# Patient Record
Sex: Male | Born: 1975 | Race: Black or African American | Hispanic: No | Marital: Single | Smoking: Current every day smoker
Health system: Southern US, Community
[De-identification: ages and names within clinical notes are randomized; demographics above are authoritative.]

## PROBLEM LIST (undated history)

## (undated) DIAGNOSIS — I10 Essential (primary) hypertension: Secondary | ICD-10-CM

## (undated) DIAGNOSIS — J40 Bronchitis, not specified as acute or chronic: Secondary | ICD-10-CM

## (undated) DIAGNOSIS — M1990 Unspecified osteoarthritis, unspecified site: Secondary | ICD-10-CM

## (undated) DIAGNOSIS — B019 Varicella without complication: Secondary | ICD-10-CM

## (undated) DIAGNOSIS — J45909 Unspecified asthma, uncomplicated: Secondary | ICD-10-CM

## (undated) HISTORY — DX: Varicella without complication: B01.9

## (undated) HISTORY — DX: Unspecified osteoarthritis, unspecified site: M19.90

## (undated) HISTORY — DX: Bronchitis, not specified as acute or chronic: J40

## (undated) HISTORY — PX: WISDOM TOOTH EXTRACTION: SHX21

## (undated) HISTORY — DX: Essential (primary) hypertension: I10

---

## 2013-05-05 ENCOUNTER — Emergency Department: Payer: Self-pay | Admitting: Emergency Medicine

## 2015-04-20 ENCOUNTER — Encounter: Payer: Self-pay | Admitting: Emergency Medicine

## 2015-04-20 ENCOUNTER — Emergency Department
Admission: EM | Admit: 2015-04-20 | Discharge: 2015-04-20 | Disposition: A | Discharge status: Home / Self Care | Payer: BLUE CROSS/BLUE SHIELD | Attending: Emergency Medicine | Admitting: Emergency Medicine

## 2015-04-20 ENCOUNTER — Emergency Department: Payer: BLUE CROSS/BLUE SHIELD

## 2015-04-20 DIAGNOSIS — R0981 Nasal congestion: Secondary | ICD-10-CM

## 2015-04-20 DIAGNOSIS — R51 Headache: Secondary | ICD-10-CM | POA: Diagnosis not present

## 2015-04-20 DIAGNOSIS — Z720 Tobacco use: Secondary | ICD-10-CM | POA: Diagnosis not present

## 2015-04-20 DIAGNOSIS — M25511 Pain in right shoulder: Secondary | ICD-10-CM | POA: Diagnosis not present

## 2015-04-20 DIAGNOSIS — Z9889 Other specified postprocedural states: Secondary | ICD-10-CM | POA: Insufficient documentation

## 2015-04-20 DIAGNOSIS — R0989 Other specified symptoms and signs involving the circulatory and respiratory systems: Secondary | ICD-10-CM | POA: Insufficient documentation

## 2015-04-20 DIAGNOSIS — K088 Other specified disorders of teeth and supporting structures: Secondary | ICD-10-CM | POA: Diagnosis present

## 2015-04-20 LAB — CBC WITH DIFFERENTIAL/PLATELET
Basophils Absolute: 0 10*3/uL (ref 0–0.1)
Basophils Relative: 1 %
EOS PCT: 3 %
Eosinophils Absolute: 0.2 10*3/uL (ref 0–0.7)
HCT: 51.4 % (ref 40.0–52.0)
Hemoglobin: 16.5 g/dL (ref 13.0–18.0)
LYMPHS PCT: 28 %
Lymphs Abs: 1.8 10*3/uL (ref 1.0–3.6)
MCH: 27.2 pg (ref 26.0–34.0)
MCHC: 32.1 g/dL (ref 32.0–36.0)
MCV: 84.6 fL (ref 80.0–100.0)
Monocytes Absolute: 0.8 10*3/uL (ref 0.2–1.0)
Monocytes Relative: 13 %
NEUTROS ABS: 3.6 10*3/uL (ref 1.4–6.5)
NEUTROS PCT: 55 %
PLATELETS: 253 10*3/uL (ref 150–440)
RBC: 6.07 MIL/uL — ABNORMAL HIGH (ref 4.40–5.90)
RDW: 13.6 % (ref 11.5–14.5)
WBC: 6.4 10*3/uL (ref 3.8–10.6)

## 2015-04-20 LAB — BASIC METABOLIC PANEL
Anion gap: 10 (ref 5–15)
BUN: 11 mg/dL (ref 6–20)
CALCIUM: 9.6 mg/dL (ref 8.9–10.3)
CO2: 26 mmol/L (ref 22–32)
Chloride: 102 mmol/L (ref 101–111)
Creatinine, Ser: 1.01 mg/dL (ref 0.61–1.24)
GFR calc Af Amer: >60 mL/min (ref >60)
GLUCOSE: 100 mg/dL — AB (ref 65–99)
Potassium: 4 mmol/L (ref 3.5–5.1)
Sodium: 138 mmol/L (ref 135–145)

## 2015-04-20 MED ORDER — ORPHENADRINE CITRATE 30 MG/ML IJ SOLN
30.0000 mg | Freq: Once | INTRAMUSCULAR | Status: AC
  Administered 2015-04-20: 30 mg via INTRAVENOUS

## 2015-04-20 MED ORDER — DEXAMETHASONE SODIUM PHOSPHATE 10 MG/ML IJ SOLN
10.0000 mg | Freq: Once | INTRAMUSCULAR | Status: AC
  Administered 2015-04-20: 10 mg via INTRAMUSCULAR

## 2015-04-20 MED ORDER — ORPHENADRINE CITRATE 30 MG/ML IJ SOLN: 60.0000 mg | Freq: Two times a day (BID) | INTRAMUSCULAR | Status: DC

## 2015-04-20 MED ORDER — FEXOFENADINE-PSEUDOEPHED ER 60-120 MG PO TB12: 1.0000 | ORAL_TABLET | Freq: Two times a day (BID) | ORAL | Status: DC

## 2015-04-20 NOTE — ED Provider Notes (Signed)
[facility] Emergency Department Provider Note  ____________________________________________  Time seen: Approximately 10:28 AM  I have reviewed the triage vital signs and the nursing notes.   HISTORY  Chief Complaint Dental Pain    HPI Roberto Murphy is a 39 y.o. male complaining of left facial pain for 2-3 days. Patient state he had a tooth extraction 5 days ago. The extraction was a molar on the left lower side. Patient states since then even having headache and facial soreness. Patient is concerned now because he's had increased pain behind the left eye. Patient also complaining of nasal congestion and ear pressure. Patient denies any vision change or other I URI signs and symptoms. Patient is currently on penicillin ibuprofen and Percocets. Patient is rating his pain as a 4/10.  History reviewed. No pertinent past medical history.  There are no active problems to display for this patient.   Past Surgical History  Procedure Laterality Date  . Wisdom tooth extraction      No current outpatient prescriptions on file.  Allergies Shellfish allergy  No family history on file.  Social History History  Substance Use Topics  . Smoking status: Current Every Day Smoker  . Smokeless tobacco: Not on file  . Alcohol Use: Yes    Review of Systems Constitutional: No fever/chills. Left facial pain and pressure Eyes: No visual changes. ENT: No sore throat. Cardiovascular: Denies chest pain. Respiratory: Denies shortness of breath. Gastrointestinal: No abdominal pain.  No nausea, no vomiting.  No diarrhea.  No constipation. Genitourinary: Negative for dysuria. Musculoskeletal: Right shoulder pain which he believes is secondary to a pinched nerve. Skin: Negative for rash. Neurological: Negative for headaches, focal weakness or numbness. Allergic/Immunilogical: **} 10-point ROS otherwise negative.  ____________________________________________   PHYSICAL  EXAM:  VITAL SIGNS: ED Triage Vitals  Enc Vitals Group     BP 04/20/15 1000 144/95 mmHg     Pulse Rate 04/20/15 1000 100     Resp 04/20/15 1000 20     Temp 04/20/15 1000 99 F (37.2 C)     Temp Source 04/20/15 1000 Oral     SpO2 04/20/15 1000 98 %     Weight 04/20/15 1000 159 lb (72.122 kg)     Height 04/20/15 1000 5\' 2"  (1.575 m)     Head Cir --      Peak Flow --      Pain Score 04/20/15 1001 4     Pain Loc --      Pain Edu? --      Excl. in GC? --     Constitutional: Alert and oriented. Mild distress. Low-grade fever. Eyes: Conjunctivae are normal. PERRL. EOMI. Head: Atraumatic. Nose: Left maxillary guarding. No rhinorrhea at this time. Mouth/Throat: Mucous membranes are moist.  Oropharynx non-erythematous. No obvious erythema and mild edema around the tooth extraction left lower molar. Neck: No stridor.  No cervical spine tenderness to palpation. Hematological/Lymphatic/Immunilogical: No cervical lymphadenopathy. Cardiovascular: Normal rate, regular rhythm. Grossly normal heart sounds.  Good peripheral circulation. Respiratory: Normal respiratory effort.  No retractions. Lungs CTAB. Gastrointestinal: Soft and nontender. No distention. No abdominal bruits. No CVA tenderness. Musculoskeletal: No deformity of the right shoulder tender palpation [facility] the [facility] and decreased range of motion with abduction. Neurologic:  Normal speech and language. No gross focal neurologic deficits are appreciated. Speech is normal. No gait instability. Skin:  Skin is warm, dry and intact. No rash noted. Psychiatric: Mood and affect are normal. Speech and behavior are normal.  ____________________________________________   LABS (all labs ordered are listed, but only abnormal results are displayed)  Labs Reviewed  BASIC METABOLIC PANEL - Abnormal; Notable for the following:    Glucose, Bld 100 (*)    All other components within normal limits  CBC WITH DIFFERENTIAL/PLATELET - Abnormal;  Notable for the following:    RBC 6.07 (*)    All other components within normal limits   ____________________________________________  EKG   ____________________________________________  RADIOLOGY  No acute findings of sinus x-ray. ____________________________________________   PROCEDURES  Procedure(s) performed: None  Critical Care performed: No  ____________________________________________   INITIAL IMPRESSION / ASSESSMENT AND PLAN / ED COURSE  Pertinent labs & imaging results that were available during my care of the patient were reviewed by me and considered in my medical decision making (see chart for details).  Sinus congestion. Right shoulder pain. Patient advised to continue previous medications Sustiva narcotic pain medicine and time for inflammatory and ibuprofen. Patient will be given a prescription for Allegra-D take as directed. Patient given a work note. Patient advised follow his PCP if there is no bruise the next 2 days. ____________________________________________   FINAL CLINICAL IMPRESSION(S) / ED DIAGNOSES  Final diagnoses:  Congestion of paranasal sinus  Right shoulder pain      Joni Reining, PA-C 04/20/15 1233  Darien Ramus, MD 04/20/15 1524

## 2015-04-20 NOTE — Discharge Instructions (Signed)
Continue previous medications and start Allergra -D as directed. Wear sling for 2-3 days as needed.

## 2015-04-20 NOTE — ED Notes (Signed)
Patient transported to X-ray 

## 2015-04-20 NOTE — ED Notes (Signed)
Had tooth pulled on left side few days ago, is on antibiotics for it, now has some pain behind his left eye

## 2015-04-20 NOTE — ED Notes (Signed)
Pt to ed with c/o tooth extraction on Thursday of last week reports pain in left side of face since then, states headache and left face soreness.

## 2015-11-16 ENCOUNTER — Emergency Department: Payer: BLUE CROSS/BLUE SHIELD

## 2015-11-16 ENCOUNTER — Encounter: Payer: Self-pay | Admitting: Emergency Medicine

## 2015-11-16 ENCOUNTER — Emergency Department
Admission: EM | Admit: 2015-11-16 | Discharge: 2015-11-16 | Disposition: A | Discharge status: Home / Self Care | Payer: BLUE CROSS/BLUE SHIELD | Attending: Emergency Medicine | Admitting: Emergency Medicine

## 2015-11-16 DIAGNOSIS — J029 Acute pharyngitis, unspecified: Secondary | ICD-10-CM | POA: Diagnosis present

## 2015-11-16 DIAGNOSIS — F458 Other somatoform disorders: Secondary | ICD-10-CM | POA: Diagnosis not present

## 2015-11-16 DIAGNOSIS — Z79899 Other long term (current) drug therapy: Secondary | ICD-10-CM | POA: Diagnosis not present

## 2015-11-16 DIAGNOSIS — F17200 Nicotine dependence, unspecified, uncomplicated: Secondary | ICD-10-CM | POA: Diagnosis not present

## 2015-11-16 DIAGNOSIS — K2100 Gastro-esophageal reflux disease with esophagitis, without bleeding: Secondary | ICD-10-CM

## 2015-11-16 DIAGNOSIS — R09A2 Foreign body sensation, throat: Secondary | ICD-10-CM

## 2015-11-16 DIAGNOSIS — R300 Dysuria: Secondary | ICD-10-CM

## 2015-11-16 DIAGNOSIS — R0989 Other specified symptoms and signs involving the circulatory and respiratory systems: Secondary | ICD-10-CM

## 2015-11-16 LAB — CHLAMYDIA/NGC RT PCR (ARMC ONLY)
Chlamydia Tr: NOT DETECTED
N GONORRHOEAE: NOT DETECTED

## 2015-11-16 LAB — URINALYSIS COMPLETE WITH MICROSCOPIC (ARMC ONLY)
BILIRUBIN URINE: NEGATIVE
Bacteria, UA: NONE SEEN
Glucose, UA: NEGATIVE mg/dL
Hgb urine dipstick: NEGATIVE
KETONES UR: NEGATIVE mg/dL
Nitrite: NEGATIVE
Protein, ur: NEGATIVE mg/dL
SQUAMOUS EPITHELIAL / LPF: NONE SEEN
Specific Gravity, Urine: 1.014 (ref 1.005–1.030)
WBC, UA: NONE SEEN WBC/hpf (ref 0–5)
pH: 7 (ref 5.0–8.0)

## 2015-11-16 MED ORDER — OMEPRAZOLE 20 MG PO CPDR: 20.0000 mg | DELAYED_RELEASE_CAPSULE | Freq: Two times a day (BID) | ORAL | Status: DC

## 2015-11-16 MED ORDER — GI COCKTAIL ~~LOC~~
30.0000 mL | Freq: Once | ORAL | Status: AC
  Administered 2015-11-16: 30 mL via ORAL
  Filled 2015-11-16: qty 30

## 2015-11-16 NOTE — ED Notes (Signed)
Spoke with Dr. Scotty Court and no orders received for this pt at this time.

## 2015-11-16 NOTE — Discharge Instructions (Signed)

## 2015-11-16 NOTE — ED Notes (Addendum)
Pt to ed with c/o sore throat x 2 days.  Pt states he needs to drink water frequently in order to get solids down.  Pt states "It feels like something is stuck in my throat"  Pt states feels worse at night when he tries to lay flat.  Pt alert and oriented and denies chest pain at this time.  Pt is able to swallow saliva.

## 2015-11-16 NOTE — ED Notes (Signed)
States he developed a sore throat 2 days ago with some diff swallowing at that time..noticed some chest discomfort also.   Worse at night. No drooling noted

## 2015-11-16 NOTE — ED Provider Notes (Signed)
[facility] Emergency Department Provider Note ____________________________________________  Time seen: Approximately 1:51 PM  I have reviewed the triage vital signs and the nursing notes.   HISTORY  Chief Complaint Sore Throat   HPI Roberto Murphy is a 40 y.o. male who presents to the emergency department for evaluation of dysphasia. He states that 2 days ago he began having difficulty swallowing solid foods without also swallowing water. He states that he has had a strange sensation in his throat and a burning in his chest that seems to be worse at night. He states that he has also had some wheezing at night. He denies nausea or vomiting. He denies fever. He is also complaining of burning on urination. This started today. He denies previous history of any of the above complaints. He did take a Zantac yesterday which seemed to relieve some of the dysphasia and burning in the chest.   History reviewed. No pertinent past medical history.  There are no active problems to display for this patient.   Past Surgical History  Procedure Laterality Date  . Wisdom tooth extraction      Current Outpatient Rx  Name  Route  Sig  Dispense  Refill  . fexofenadine-pseudoephedrine (ALLEGRA-D) 60-120 MG per tablet   Oral   Take 1 tablet by mouth 2 (two) times daily.   30 tablet   0   . omeprazole (PRILOSEC) 20 MG capsule   Oral   Take 1 capsule (20 mg total) by mouth 2 (two) times daily.   60 capsule   0     Allergies Shellfish allergy  No family history on file.  Social History Social History  Substance Use Topics  . Smoking status: Current Every Day Smoker  . Smokeless tobacco: None  . Alcohol Use: Yes    Review of Systems Constitutional: No fever/chills Eyes: No visual changes. ENT: Positive for sore throat and dysphasia. Cardiovascular: Positive for chest pain. Respiratory: Denies shortness of breath. Gastrointestinal: No abdominal pain.  No  nausea, no vomiting.  No diarrhea.  No constipation. Genitourinary: Positive for dysuria. Musculoskeletal: Negative for back pain. Skin: Negative for rash. Neurological: Negative for headaches, focal weakness or numbness.  10-point ROS otherwise unremarkable.  ____________________________________________   PHYSICAL EXAM:  VITAL SIGNS: ED Triage Vitals  Enc Vitals Group     BP 11/16/15 1212 148/95 mmHg     Pulse Rate 11/16/15 1212 104     Resp 11/16/15 1212 20     Temp 11/16/15 1212 98.3 F (36.8 C)     Temp Source 11/16/15 1212 Oral     SpO2 11/16/15 1212 97 %     Weight 11/16/15 1212 159 lb (72.122 kg)     Height --      Head Cir --      Peak Flow --      Pain Score 11/16/15 1212 2     Pain Loc --      Pain Edu? --      Excl. in GC? --     Constitutional: Alert and oriented. Well appearing and in no acute distress. Eyes: Conjunctivae are normal. PERRL. EOMI. Head: Atraumatic. Nose: No congestion/rhinnorhea. Mouth/Throat: Mucous membranes are moist.  Oropharynx non-erythematous or edematous. Uvula midline Neck: No stridor.   Hematological/Lymphatic/Immunilogical: No cervical lymphadenopathy. Cardiovascular: Normal rate, regular rhythm. Grossly normal heart sounds. Good peripheral circulation. Respiratory: Normal respiratory effort.  No retractions. Lungs CTAB. Gastrointestinal: Soft and nontender. No distention. No abdominal bruits. Genitourinary: Exam deferred--patient denies  discharge or abnormality.  Musculoskeletal: No lower extremity tenderness nor edema.  No joint effusions. Neurologic:  Normal speech and language. No gross focal neurologic deficits are appreciated. No gait instability. Skin:  Skin is warm, dry and intact. No rash noted. Psychiatric: Mood and affect are normal. Speech and behavior are normal.  ____________________________________________   LABS (all labs ordered are listed, but only abnormal results are displayed)  Labs Reviewed   URINALYSIS COMPLETEWITH MICROSCOPIC ([facility] ONLY) - Abnormal; Notable for the following:    Color, Urine YELLOW (*)    APPearance CLEAR (*)    Leukocytes, UA TRACE (*)    All other components within normal limits  CHLAMYDIA/NGC RT PCR ([facility] ONLY)   ____________________________________________  EKG  Not indicated. ____________________________________________  RADIOLOGY  Mild bronchitic changes, otherwise negative for acute abnormality per radiology. ____________________________________________   PROCEDURES  Procedure(s) performed: None  Critical Care performed: No  ____________________________________________   INITIAL IMPRESSION / ASSESSMENT AND PLAN / ED COURSE  Pertinent labs & imaging results that were available during my care of the patient were reviewed by me and considered in my medical decision making (see chart for details).  GI cocktail provided moderate relief of symptoms. Will discharge home on omeprazole and have him follow up with GI. Urinalysis showing low concern for infectious process. GC and Chlamydia results are pending. Patient to return to the  ER for symptoms that change or worsen if unable to schedule an appointment with GI or PCP of choice. ____________________________________________   FINAL CLINICAL IMPRESSION(S) / ED DIAGNOSES  Final diagnoses:  Dysuria  Globus sensation  Gastroesophageal reflux disease with esophagitis      Chinita Pester, FNP 11/16/15 1522  Emily Filbert, MD 11/16/15 1534

## 2015-11-18 ENCOUNTER — Emergency Department
Admission: EM | Admit: 2015-11-18 | Discharge: 2015-11-18 | Disposition: A | Discharge status: Home / Self Care | Payer: BLUE CROSS/BLUE SHIELD | Attending: Emergency Medicine | Admitting: Emergency Medicine

## 2015-11-18 ENCOUNTER — Emergency Department: Payer: BLUE CROSS/BLUE SHIELD

## 2015-11-18 ENCOUNTER — Encounter: Payer: Self-pay | Admitting: Emergency Medicine

## 2015-11-18 DIAGNOSIS — N3090 Cystitis, unspecified without hematuria: Secondary | ICD-10-CM

## 2015-11-18 DIAGNOSIS — J45901 Unspecified asthma with (acute) exacerbation: Secondary | ICD-10-CM | POA: Insufficient documentation

## 2015-11-18 DIAGNOSIS — R319 Hematuria, unspecified: Secondary | ICD-10-CM | POA: Diagnosis present

## 2015-11-18 DIAGNOSIS — N3091 Cystitis, unspecified with hematuria: Secondary | ICD-10-CM | POA: Insufficient documentation

## 2015-11-18 DIAGNOSIS — Z79899 Other long term (current) drug therapy: Secondary | ICD-10-CM | POA: Insufficient documentation

## 2015-11-18 DIAGNOSIS — F17200 Nicotine dependence, unspecified, uncomplicated: Secondary | ICD-10-CM | POA: Insufficient documentation

## 2015-11-18 DIAGNOSIS — J209 Acute bronchitis, unspecified: Secondary | ICD-10-CM

## 2015-11-18 HISTORY — DX: Unspecified asthma, uncomplicated: J45.909

## 2015-11-18 LAB — URINALYSIS COMPLETE WITH MICROSCOPIC (ARMC ONLY)
BACTERIA UA: NONE SEEN
Bilirubin Urine: NEGATIVE
GLUCOSE, UA: NEGATIVE mg/dL
LEUKOCYTES UA: NEGATIVE
Nitrite: NEGATIVE
Protein, ur: >500 mg/dL — AB
SPECIFIC GRAVITY, URINE: 1.037 — AB (ref 1.005–1.030)
pH: 5 (ref 5.0–8.0)

## 2015-11-18 LAB — CBC WITH DIFFERENTIAL/PLATELET
Basophils Absolute: 0 10*3/uL (ref 0–0.1)
Basophils Relative: 0 %
Eosinophils Absolute: 0.1 10*3/uL (ref 0–0.7)
Eosinophils Relative: 1 %
HEMATOCRIT: 48.3 % (ref 40.0–52.0)
Hemoglobin: 15.7 g/dL (ref 13.0–18.0)
Lymphocytes Relative: 28 %
Lymphs Abs: 2.4 10*3/uL (ref 1.0–3.6)
MCH: 26.8 pg (ref 26.0–34.0)
MCHC: 32.6 g/dL (ref 32.0–36.0)
MCV: 82.5 fL (ref 80.0–100.0)
MONOS PCT: 5 %
Monocytes Absolute: 0.4 10*3/uL (ref 0.2–1.0)
NEUTROS ABS: 5.7 10*3/uL (ref 1.4–6.5)
Neutrophils Relative %: 66 %
Platelets: 242 10*3/uL (ref 150–440)
RBC: 5.85 MIL/uL (ref 4.40–5.90)
RDW: 13.7 % (ref 11.5–14.5)
WBC: 8.7 10*3/uL (ref 3.8–10.6)

## 2015-11-18 LAB — COMPREHENSIVE METABOLIC PANEL
ALBUMIN: 4.3 g/dL (ref 3.5–5.0)
ALK PHOS: 71 U/L (ref 38–126)
ALT: 19 U/L (ref 17–63)
ANION GAP: 6 (ref 5–15)
AST: 19 U/L (ref 15–41)
BUN: 12 mg/dL (ref 6–20)
CALCIUM: 9.1 mg/dL (ref 8.9–10.3)
CHLORIDE: 102 mmol/L (ref 101–111)
CO2: 29 mmol/L (ref 22–32)
Creatinine, Ser: 1.02 mg/dL (ref 0.61–1.24)
GFR calc non Af Amer: >60 mL/min (ref >60)
GLUCOSE: 99 mg/dL (ref 65–99)
Potassium: 3.9 mmol/L (ref 3.5–5.1)
SODIUM: 137 mmol/L (ref 135–145)
Total Bilirubin: 1 mg/dL (ref 0.3–1.2)
Total Protein: 7.8 g/dL (ref 6.5–8.1)

## 2015-11-18 MED ORDER — PHENAZOPYRIDINE HCL 100 MG PO TABS: 100.0000 mg | ORAL_TABLET | Freq: Three times a day (TID) | ORAL | Status: DC | PRN

## 2015-11-18 MED ORDER — CIPROFLOXACIN HCL 500 MG PO TABS: 500.0000 mg | ORAL_TABLET | Freq: Two times a day (BID) | ORAL | Status: DC

## 2015-11-18 MED ORDER — ALBUTEROL SULFATE HFA 108 (90 BASE) MCG/ACT IN AERS: 1.0000 | INHALATION_SPRAY | Freq: Four times a day (QID) | RESPIRATORY_TRACT | Status: DC | PRN

## 2015-11-18 MED ORDER — ALBUTEROL SULFATE (2.5 MG/3ML) 0.083% IN NEBU
5.0000 mg | INHALATION_SOLUTION | Freq: Once | RESPIRATORY_TRACT | Status: AC
Start: 2015-11-18 — End: 2015-11-18
  Administered 2015-11-18: 5 mg via RESPIRATORY_TRACT
  Filled 2015-11-18: qty 6

## 2015-11-18 NOTE — ED Provider Notes (Signed)
[facility] Emergency Department Provider Note  ____________________________________________  Time seen: Approximately 12:08 PM  I have reviewed the triage vital signs and the nursing notes.   HISTORY  Chief Complaint Hematuria    HPI Bufford Helms is a 40 y.o. male, NAD, reports the emergency department with episode of hematuria this morning. Also notes some decrease in urinary stream with increase in urinary frequency with some hesitancy. Denies any abdominal pain, lower back pain, urethral discharge. No fevers, chills, body aches. Was seen 2 days ago in this emergency department for upper respiratory symptoms. States he continues with cough, chest congestion and some wheezing. Notes he has been taking outpatient medicines as prescribed.   Past Medical History  Diagnosis Date  . Asthma     There are no active problems to display for this patient.   Past Surgical History  Procedure Laterality Date  . Wisdom tooth extraction      Current Outpatient Rx  Name  Route  Sig  Dispense  Refill  . albuterol (PROVENTIL HFA;VENTOLIN HFA) 108 (90 Base) MCG/ACT inhaler   Inhalation   Inhale 1-2 puffs into the lungs every 6 (six) hours as needed for wheezing or shortness of breath.   1 Inhaler   2   . ciprofloxacin (CIPRO) 500 MG tablet   Oral   Take 1 tablet (500 mg total) by mouth 2 (two) times daily.   20 tablet   0   . fexofenadine-pseudoephedrine (ALLEGRA-D) 60-120 MG per tablet   Oral   Take 1 tablet by mouth 2 (two) times daily.   30 tablet   0   . omeprazole (PRILOSEC) 20 MG capsule   Oral   Take 1 capsule (20 mg total) by mouth 2 (two) times daily.   60 capsule   0   . phenazopyridine (PYRIDIUM) 100 MG tablet   Oral   Take 1 tablet (100 mg total) by mouth 3 (three) times daily as needed for pain (May take 1-2 as needed three times daily).   20 tablet   0     Allergies Shellfish allergy  No family history on file.  Social  History Social History  Substance Use Topics  . Smoking status: Current Every Day Smoker  . Smokeless tobacco: None  . Alcohol Use: Yes     Review of Systems  Constitutional: No fever/chills. Some fatigue. ENT: No sore throat. No nasal congestion Cardiovascular: No chest pain, palpitations. Respiratory: Positive for cough, wheeze. Denies shortness of breath Gastrointestinal: No abdominal pain.  No nausea, no vomiting.   Genitourinary: Positive for hematuria, urinary frequency and urinary hesitancy with some urgency. Negative for dysuria, urethral discharge Musculoskeletal: Negative for back pain nor any other myalgias Skin: Negative for rash or lesions Neurological: Negative for headaches, focal weakness or numbness. 10-point ROS otherwise negative.  ____________________________________________   PHYSICAL EXAM:  VITAL SIGNS: ED Triage Vitals  Enc Vitals Group     BP 11/18/15 1130 167/109 mmHg     Pulse Rate 11/18/15 1130 107     Resp 11/18/15 1130 16     Temp 11/18/15 1130 98.7 F (37.1 C)     Temp Source 11/18/15 1130 Oral     SpO2 11/18/15 1130 94 %     Weight 11/18/15 1130 145 lb (65.772 kg)     Height 11/18/15 1130  (1.575 m)     Head Cir --      Peak Flow --      Pain Score 11/18/15  1130 5     Pain Loc --      Pain Edu? --      Excl. in GC? --     Constitutional: Alert and oriented. Well appearing and in no acute distress. Eyes: Conjunctivae are normal.  Head: Atraumatic. ENT:      Ears:       Nose: No congestion/rhinnorhea.      Mouth/Throat:  Neck: No stridor.  Hematological/Lymphatic/Immunilogical: No cervical lymphadenopathy. Cardiovascular: Normal rate, regular rhythm. Normal S1 and S2.   Respiratory: Normal respiratory effort without tachypnea or retractions. Mild wheezing bilaterally. Breath sounds noted throughout bilaterally. Gastrointestinal: Soft and nontender. No distention. No CVA tenderness. Musculoskeletal: No lower extremity  tenderness nor edema.  No joint effusions. Neurologic:  Normal speech and language. No gross focal neurologic deficits are appreciated.  Skin:  Skin is warm, dry and intact. No rash noted. Psychiatric: Mood and affect are normal. Speech and behavior are normal. Patient exhibits appropriate insight and judgement.   ____________________________________________   LABS (all labs ordered are listed, but only abnormal results are displayed)  Labs Reviewed  URINALYSIS COMPLETEWITH MICROSCOPIC ([facility] ONLY) - Abnormal; Notable for the following:    Color, Urine AMBER (*)    APPearance HAZY (*)    Ketones, ur 1+ (*)    Specific Gravity, Urine 1.037 (*)    Hgb urine dipstick 3+ (*)    Protein, ur >500 (*)    Squamous Epithelial / LPF 0-5 (*)    All other components within normal limits  URINE CULTURE  COMPREHENSIVE METABOLIC PANEL  CBC WITH DIFFERENTIAL/PLATELET   ____________________________________________  EKG  None ____________________________________________  RADIOLOGY I, Hagler,Jami L, personally viewed and evaluated these images (plain radiographs) as part of my medical decision making, as well as reviewing the written report by the radiologist.  Ct Renal Stone Study  11/18/2015  CLINICAL DATA:  Dysuria and hematuria for 2 days, looked like he had tissue in his urine in the ED EXAM: CT ABDOMEN AND PELVIS WITHOUT CONTRAST TECHNIQUE: Multidetector CT imaging of the abdomen and pelvis was performed following the standard protocol without IV contrast. Sagittal and coronal MPR images reconstructed from axial data set. Oral contrast not administered for this indication. COMPARISON:  None FINDINGS: Lung bases clear. Kidneys normal appearance for noncontrast technique. No mass, hydronephrosis, ureteral dilatation or ureteral calcification. Within limits of a nonenhanced exam, liver, gallbladder, spleen, pancreas, and adrenal glands normal appearance. Thickened bladder wall with mild  perivesicular infiltration compatible with cystitis. Normal appendix. Stomach and bowel loops normal appearance for technique. No mass, adenopathy, hernia, free air or free fluid. Osseous structures unremarkable. IMPRESSION: Thickened bladder wall with mild perivesicular infiltration compatible with cystitis. Otherwise negative exam. Electronically Signed   By: Ulyses Southward M.D.   On: 11/18/2015 13:29    ____________________________________________    PROCEDURES  Procedure(s) performed: None    Medications  albuterol (PROVENTIL) (2.5 MG/3ML) 0.083% nebulizer solution 5 mg (5 mg Nebulization Given 11/18/15 1232)     ____________________________________________   INITIAL IMPRESSION / ASSESSMENT AND PLAN / ED COURSE  ----------------------------------------- 1:03 PM on 11/18/2015 -----------------------------------------  I personally completed lung auscultation after cessation of breathing treatment. Lungs were CTAB.   ----------------------------------------- 1:40 PM on 11/18/2015 -----------------------------------------  CT stone protocol notes no significant abnormalities other than some thickening of the bladder wall potentially relatable to cystitis.  Pertinent labs & imaging results that were available during my care of the patient were reviewed by me and considered in my medical decision  making (see chart for details).  Patient's diagnosis is consistent with cystitis with hematuria as well as acute bronchitis with bronchospasm. CT results per above. All lab results including CBC with differential and CMP were completed without any abnormalities. Patient will be discharged home with prescriptions for Cipro to take twice a day for 10 days to cover for any urinary bacterial infection, Pyridium for urinary pain and an albuterol inhaler to be used as needed for wheezing or cough. Patient is to follow up with urology for further workup of cystitis and heamturia. Patient advised to  establish with a primary care provider to follow-up if symptoms persist past this treatment course and manage chronic additions. Patient is given ED precautions to return to the ED for any worsening or new symptoms.     ____________________________________________  FINAL CLINICAL IMPRESSION(S) / ED DIAGNOSES  Final diagnoses:  Cystitis  Hematuria  Bronchospasm with bronchitis, acute      NEW MEDICATIONS STARTED DURING THIS VISIT:  New Prescriptions   ALBUTEROL (PROVENTIL HFA;VENTOLIN HFA) 108 (90 BASE) MCG/ACT INHALER    Inhale 1-2 puffs into the lungs every 6 (six) hours as needed for wheezing or shortness of breath.   CIPROFLOXACIN (CIPRO) 500 MG TABLET    Take 1 tablet (500 mg total) by mouth 2 (two) times daily.   PHENAZOPYRIDINE (PYRIDIUM) 100 MG TABLET    Take 1 tablet (100 mg total) by mouth 3 (three) times daily as needed for pain (May take 1-2 as needed three times daily).        Hope Pigeon, PA-C 11/18/15 1422  Emily Filbert, MD 11/18/15 [phone]

## 2015-11-18 NOTE — ED Notes (Signed)
Pt says he has blood in urine since this am.  Says was seen here recent for resp, and had urine test then for burning with urination.

## 2015-11-18 NOTE — Discharge Instructions (Signed)

## 2015-11-18 NOTE — ED Notes (Signed)
States he was seen 2 days ago..but states he noticed some blood in urine today  Mainly at the end of the stream

## 2015-11-20 LAB — URINE CULTURE

## 2015-11-24 ENCOUNTER — Emergency Department (HOSPITAL_COMMUNITY)
Admission: EM | Admit: 2015-11-24 | Discharge: 2015-11-24 | Disposition: A | Discharge status: Home / Self Care | Payer: BLUE CROSS/BLUE SHIELD | Attending: Emergency Medicine | Admitting: Emergency Medicine

## 2015-11-24 ENCOUNTER — Ambulatory Visit: Payer: Self-pay | Admitting: Urology

## 2015-11-24 ENCOUNTER — Encounter (HOSPITAL_COMMUNITY): Payer: Self-pay | Admitting: Emergency Medicine

## 2015-11-24 DIAGNOSIS — J45909 Unspecified asthma, uncomplicated: Secondary | ICD-10-CM | POA: Insufficient documentation

## 2015-11-24 DIAGNOSIS — Z87891 Personal history of nicotine dependence: Secondary | ICD-10-CM | POA: Insufficient documentation

## 2015-11-24 DIAGNOSIS — Z79899 Other long term (current) drug therapy: Secondary | ICD-10-CM | POA: Diagnosis not present

## 2015-11-24 DIAGNOSIS — R21 Rash and other nonspecific skin eruption: Secondary | ICD-10-CM | POA: Diagnosis not present

## 2015-11-24 DIAGNOSIS — Z792 Long term (current) use of antibiotics: Secondary | ICD-10-CM | POA: Diagnosis not present

## 2015-11-24 MED ORDER — PREDNISONE 50 MG PO TABS: 50.0000 mg | ORAL_TABLET | Freq: Every day | ORAL | Status: DC

## 2015-11-24 MED ORDER — DIPHENHYDRAMINE HCL 25 MG PO CAPS
25.0000 mg | ORAL_CAPSULE | Freq: Once | ORAL | Status: AC
  Administered 2015-11-24: 25 mg via ORAL
  Filled 2015-11-24: qty 1

## 2015-11-24 MED ORDER — PREDNISONE 20 MG PO TABS
60.0000 mg | ORAL_TABLET | Freq: Once | ORAL | Status: AC
  Administered 2015-11-24: 60 mg via ORAL
  Filled 2015-11-24: qty 3

## 2015-11-24 NOTE — Discharge Instructions (Signed)
Take Benadryl as well.  Return here as needed.  Follow-up with a primary care doctor

## 2015-11-24 NOTE — ED Notes (Signed)
Pt reports rash to hands and feet x 1 week which is painful in nature. Pt alert x4. NAD at this time.

## 2015-11-24 NOTE — ED Provider Notes (Signed)
CSN: [id number]     Arrival date & time 11/24/15  4098 History   First MD Initiated Contact with Patient 11/24/15 1010     Chief Complaint  Patient presents with  . Rash     (Consider location/radiation/quality/duration/timing/severity/associated sxs/prior Treatment) HPI Patient presents to the emergency department with rash to the hands and feet 1 week.  The patient states that there is some pain associated with this along with itching.  Patient states that he has not had any fever, nausea, vomiting, weakness, dizziness, headache, blurred vision, cough, dysuria, incontinence, bloody stool, hematemesis, chest pain, shortness of breath, abdominal pain, near syncope or syncope.  Patient states he did not take any medications for this.  He was recently hospitalized from another issue Past Medical History  Diagnosis Date  . Asthma    Past Surgical History  Procedure Laterality Date  . Wisdom tooth extraction     No family history on file. Social History  Substance Use Topics  . Smoking status: Former Games developer  . Smokeless tobacco: None  . Alcohol Use: Yes    Review of Systems   All other systems negative except as documented in the HPI. All pertinent positives and negatives as reviewed in the HPI.  Allergies  Shellfish allergy  Home Medications   Prior to Admission medications   Medication Sig Start Date End Date Taking? Authorizing Provider  albuterol (PROVENTIL HFA;VENTOLIN HFA) 108 (90 Base) MCG/ACT inhaler Inhale 1-2 puffs into the lungs every 6 (six) hours as needed for wheezing or shortness of breath. 11/18/15  Yes Jami L Hagler, PA-C  ciprofloxacin (CIPRO) 500 MG tablet Take 1 tablet (500 mg total) by mouth 2 (two) times daily. 11/18/15  Yes Jami L Hagler, PA-C  fexofenadine-pseudoephedrine (ALLEGRA-D) 60-120 MG per tablet Take 1 tablet by mouth 2 (two) times daily. 04/20/15  Yes Joni Reining, PA-C  omeprazole (PRILOSEC) 20 MG capsule Take 1 capsule (20 mg total) by mouth 2  (two) times daily. 11/16/15 11/15/16 Yes Cari B Triplett, FNP   BP 135/83 mmHg  Pulse 85  Temp(Src) 98.2 F (36.8 C) (Oral)  Resp 16  Wt 69.355 kg  SpO2 98% Physical Exam  Constitutional: He is oriented to person, place, and time. He appears well-developed and well-nourished. No distress.  HENT:  Head: Normocephalic and atraumatic.  Mouth/Throat: Oropharynx is clear and moist.  Eyes: Pupils are equal, round, and reactive to light.  Neck: Normal range of motion. Neck supple.  Cardiovascular: Normal rate, regular rhythm and normal heart sounds.  Exam reveals no gallop and no friction rub.   No murmur heard. Pulmonary/Chest: Effort normal and breath sounds normal. No respiratory distress. He has no wheezes.  Abdominal: Soft. Bowel sounds are normal. He exhibits no distension. There is no tenderness.  Neurological: He is alert and oriented to person, place, and time. He exhibits normal muscle tone. Coordination normal.  Skin: Skin is warm and dry. No rash noted. No erythema.  Psychiatric: He has a normal mood and affect. His behavior is normal.  Nursing note and vitals reviewed.   ED Course  Procedures (including critical care time) Labs Review Labs Reviewed  RPR    Imaging Review No results found. I have personally reviewed and evaluated these images and lab results as part of my medical decision-making.  Return here as needed.  Follow-up with your primary care doctor.  He should be treated with prednisone and Benadryl.  RPR was sent to the fact that he does have some rash  on his palms and soles   Charlestine Night, PA-C 11/26/15 1638  Vanetta Mulders, MD 11/26/15 2240

## 2015-11-25 LAB — RPR: RPR Ser Ql: NONREACTIVE

## 2016-08-11 ENCOUNTER — Emergency Department (HOSPITAL_COMMUNITY)
Admission: EM | Admit: 2016-08-11 | Discharge: 2016-08-11 | Disposition: A | Discharge status: Home / Self Care | Payer: BLUE CROSS/BLUE SHIELD | Attending: Emergency Medicine | Admitting: Emergency Medicine

## 2016-08-11 ENCOUNTER — Encounter (HOSPITAL_COMMUNITY): Payer: Self-pay | Admitting: Emergency Medicine

## 2016-08-11 ENCOUNTER — Emergency Department (HOSPITAL_COMMUNITY): Payer: BLUE CROSS/BLUE SHIELD

## 2016-08-11 DIAGNOSIS — R0789 Other chest pain: Secondary | ICD-10-CM | POA: Insufficient documentation

## 2016-08-11 DIAGNOSIS — J45909 Unspecified asthma, uncomplicated: Secondary | ICD-10-CM | POA: Diagnosis not present

## 2016-08-11 DIAGNOSIS — Z87891 Personal history of nicotine dependence: Secondary | ICD-10-CM | POA: Insufficient documentation

## 2016-08-11 DIAGNOSIS — R079 Chest pain, unspecified: Secondary | ICD-10-CM | POA: Diagnosis present

## 2016-08-11 LAB — I-STAT TROPONIN, ED: Troponin i, poc: 0 ng/mL (ref 0.00–0.08)

## 2016-08-11 LAB — BASIC METABOLIC PANEL
Anion gap: 7 (ref 5–15)
BUN: 9 mg/dL (ref 6–20)
CHLORIDE: 101 mmol/L (ref 101–111)
CO2: 28 mmol/L (ref 22–32)
CREATININE: 1.08 mg/dL (ref 0.61–1.24)
Calcium: 9.5 mg/dL (ref 8.9–10.3)
Glucose, Bld: 106 mg/dL — ABNORMAL HIGH (ref 65–99)
Potassium: 4.7 mmol/L (ref 3.5–5.1)
SODIUM: 136 mmol/L (ref 135–145)

## 2016-08-11 LAB — CBC WITH DIFFERENTIAL/PLATELET
BASOS ABS: 0 10*3/uL (ref 0.0–0.1)
BASOS PCT: 0 %
EOS ABS: 0.1 10*3/uL (ref 0.0–0.7)
EOS PCT: 1 %
HCT: 49 % (ref 39.0–52.0)
HEMOGLOBIN: 16.4 g/dL (ref 13.0–17.0)
LYMPHS ABS: 2.3 10*3/uL (ref 0.7–4.0)
Lymphocytes Relative: 26 %
MCH: 28.1 pg (ref 26.0–34.0)
MCHC: 33.5 g/dL (ref 30.0–36.0)
MCV: 83.9 fL (ref 78.0–100.0)
Monocytes Absolute: 0.7 10*3/uL (ref 0.1–1.0)
Monocytes Relative: 8 %
NEUTROS PCT: 65 %
Neutro Abs: 5.8 10*3/uL (ref 1.7–7.7)
PLATELETS: 247 10*3/uL (ref 150–400)
RBC: 5.84 MIL/uL — AB (ref 4.22–5.81)
RDW: 13.8 % (ref 11.5–15.5)
WBC: 8.8 10*3/uL (ref 4.0–10.5)

## 2016-08-11 MED ORDER — DIAZEPAM 5 MG PO TABS: 5.0000 mg | ORAL_TABLET | Freq: Two times a day (BID) | ORAL | 0 refills | Status: DC

## 2016-08-11 MED ORDER — DIAZEPAM 5 MG PO TABS
5.0000 mg | ORAL_TABLET | Freq: Once | ORAL | Status: AC
  Administered 2016-08-11: 5 mg via ORAL
  Filled 2016-08-11: qty 1

## 2016-08-11 MED ORDER — IBUPROFEN 800 MG PO TABS: 800.0000 mg | ORAL_TABLET | Freq: Three times a day (TID) | ORAL | 0 refills | Status: DC

## 2016-08-11 MED ORDER — KETOROLAC TROMETHAMINE 30 MG/ML IJ SOLN
30.0000 mg | Freq: Once | INTRAMUSCULAR | Status: AC
  Administered 2016-08-11: 30 mg via INTRAVENOUS
  Filled 2016-08-11: qty 1

## 2016-08-11 NOTE — ED Provider Notes (Signed)
[facility]-EMERGENCY DEPT Provider Note   CSN: [id number] Arrival date & time: 08/11/16  0702     History   Chief Complaint Chief Complaint  Patient presents with  . Chest Pain    HPI Roberto Murphy is a 40 y.o. male.  40 year old male with asthma who presents with chest pain. 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 When he got home, he began having sharp, constant right chest pain wrapping around to his right side and back. The pain is worse with certain movements, laying on his right side, and taking a deep breath. He reports some mild shortness of breath because it hurts to take a breath. He denies any associated nausea, vomiting, or diaphoresis. He has not taken anything for the pain but did try a heating pad last night. He denies any associated leg swelling, recent travel, history of cancer, or history of blood clots. No family history of heart disease. No fever, vomiting, diarrhea, cough/cold symptoms, or recent illness.   The history is provided by the patient.  Chest Pain      Past Medical History:  Diagnosis Date  . Asthma     There are no active problems to display for this patient.   Past Surgical History:  Procedure Laterality Date  . WISDOM TOOTH EXTRACTION         Home Medications    Prior to Admission medications   Medication Sig Start Date End Date Taking? Authorizing Provider  albuterol (PROVENTIL HFA;VENTOLIN HFA) 108 (90 Base) MCG/ACT inhaler Inhale 1-2 puffs into the lungs every 6 (six) hours as needed for wheezing or shortness of breath. 11/18/15  Yes Jami L Hagler, PA-C  ipratropium-albuterol (DUONEB) 0.5-2.5 (3) MG/3ML SOLN Take 3 mLs by nebulization every 6 (six) hours as needed (for shortness of breath or wheezing).   Yes Historical Provider, MD  ciprofloxacin (CIPRO) 500 MG tablet Take 1 tablet (500 mg total) by mouth 2  (two) times daily. Patient not taking: Reported on 08/11/2016 11/18/15   Jami L Hagler, PA-C  diazepam (VALIUM) 5 MG tablet Take 1 tablet (5 mg total) by mouth 2 (two) times daily. 08/11/16   Ambrose Finland Little, MD  fexofenadine-pseudoephedrine (ALLEGRA-D) 60-120 MG per tablet Take 1 tablet by mouth 2 (two) times daily. Patient not taking: Reported on 08/11/2016 04/20/15   Joni Reining, PA-C  ibuprofen (ADVIL,MOTRIN) 800 MG tablet Take 1 tablet (800 mg total) by mouth 3 (three) times daily. 08/11/16   Laurence Spates, MD  omeprazole (PRILOSEC) 20 MG capsule Take 1 capsule (20 mg total) by mouth 2 (two) times daily. Patient not taking: Reported on 08/11/2016 11/16/15 11/15/16  Chinita Pester, FNP  predniSONE (DELTASONE) 50 MG tablet Take 1 tablet (50 mg total) by mouth daily. Patient not taking: Reported on 08/11/2016 11/24/15   Charlestine Night, PA-C    Family History No family history on file.  Social History Social History  Substance Use Topics  . Smoking status: Former Games developer  . Smokeless tobacco: Not on file  . Alcohol use Yes     Allergies   Shellfish allergy   Review of Systems Review of Systems  Cardiovascular: Positive for chest pain.   10 Systems reviewed and are negative for acute change except as noted in the HPI.   Physical Exam Updated Vital Signs BP 141/97  Pulse 87   Temp 99 F (37.2 C) (Oral)   Resp 14   Ht 5\' 2"  (1.575 m)   Wt 158 lb (71.7 kg)   SpO2 99%   BMI 28.90 kg/m   Physical Exam  Constitutional: He is oriented to person, place, and time. He appears well-developed and well-nourished. No distress.  HENT:  Head: Normocephalic and atraumatic.  Moist mucous membranes  Eyes: Conjunctivae are normal. Pupils are equal, round, and reactive to light.  Neck: Neck supple.  Cardiovascular: Normal rate, regular rhythm and normal heart sounds.   No murmur heard. Pulmonary/Chest: Effort normal and breath sounds normal. He exhibits no  tenderness.  Abdominal: Soft. Bowel sounds are normal. He exhibits no distension. There is no tenderness.  Musculoskeletal: He exhibits no edema.  R lateral chest wall pain when raising R arm above 90 degrees  Neurological: He is alert and oriented to person, place, and time.  Fluent speech  Skin: Skin is warm and dry.  Psychiatric: He has a normal mood and affect. Judgment normal.  Nursing note and vitals reviewed.    ED Treatments / Results  Labs (all labs ordered are listed, but only abnormal results are displayed) Labs Reviewed  BASIC METABOLIC PANEL - Abnormal; Notable for the following:       Result Value   Glucose, Bld 106 (*)    All other components within normal limits  CBC WITH DIFFERENTIAL/PLATELET - Abnormal; Notable for the following:    RBC 5.84 (*)    All other components within normal limits  I-STAT TROPOININ, ED    EKG  EKG Interpretation  Date/Time:  Friday August 11 2016 07:09:46 EDT Ventricular Rate:  85 PR Interval:    QRS Duration: 91 QT Interval:  343 QTC Calculation: 408 R Axis:   83 Text Interpretation:  Sinus rhythm Consider left atrial enlargement ST elev, probable normal early repol pattern No previous ECGs available Confirmed by LITTLE MD, RACHEL ([id number]) on 08/11/2016 7:21:45 AM Also confirmed by LITTLE MD, RACHEL [phone]), editor Stout CT, Jola Babinski [phone])  on 08/11/2016 7:25:17 AM       Radiology Dg Chest 2 View  Result Date: 08/11/2016 CLINICAL DATA:  Right-sided chest and back EXAM: CHEST  2 VIEW COMPARISON:  11/16/2015 FINDINGS: The heart size and mediastinal contours are within normal limits. Both lungs are clear. The visualized skeletal structures are unremarkable. IMPRESSION: No active cardiopulmonary disease. Electronically Signed   By: Alcide Clever M.D.   On: 08/11/2016 08:02    Procedures Procedures (including critical care time)  Medications Ordered in ED Medications  ketorolac (TORADOL) 30 MG/ML injection 30 mg (30 mg  Intravenous Given 08/11/16 0752)  diazepam (VALIUM) tablet 5 mg (5 mg Oral Given 08/11/16 9629)     Initial Impression / Assessment and Plan / ED Course  I have reviewed the triage vital signs and the nursing notes.  Pertinent labs & imaging results that were available during my care of the patient were reviewed by me and considered in my medical decision making (see chart for details).  Clinical Course   Pt w/ 3d of constant R lateral chest pain worse w/ movements, laying on R side, and raising R arm. Well appearing w/ reassuring VS on exam. No ischemic changes on EKG.  Labs including troponin unremarkable and chest x-ray normal.  His description of the pain and the fact that it began after a change in his physical activities suggest musculoskeletal etiology and cardiac etiology sounds very unlikely. He is  PERC negative therefore PE very unlikely. Gave the patient Toradol and Valium in the ED and discussed supportive care including NSAID course and early ROM exercises. Discussed return precautions and patient voiced understanding. Patient discharged in satisfactory condition.  Final Clinical Impressions(s) / ED Diagnoses   Final diagnoses:  Right-sided chest wall pain    New Prescriptions New Prescriptions   DIAZEPAM (VALIUM) 5 MG TABLET    Take 1 tablet (5 mg total) by mouth 2 (two) times daily.   IBUPROFEN (ADVIL,MOTRIN) 800 MG TABLET    Take 1 tablet (800 mg total) by mouth 3 (three) times daily.     Laurence Spatesachel Morgan Little, MD 08/11/16 [id number]

## 2016-08-11 NOTE — ED Notes (Signed)
Phlebotomy here to draw blood. Pt in x-ray. This NT draw blood when pt returns

## 2016-08-11 NOTE — ED Notes (Signed)
Pt refusing blood draw at this time wishes to see a MD first. MD Little made aware.

## 2016-08-11 NOTE — ED Notes (Signed)
MD at bedside. Little  

## 2016-08-11 NOTE — ED Triage Notes (Signed)
Pt presents to ED with right sided chest pain x3 day. Pt states was recently placed on albuterol. Lungs clear.

## 2016-08-11 NOTE — ED Notes (Signed)
Pt transported to Xray. 

## 2016-09-01 IMAGING — CT CT RENAL STONE PROTOCOL
1 of 2 series · 5 of 32 positions shown, 10 images · non-contrast
Comparison: None

CLINICAL DATA: Dysuria and hematuria for 2 days, looked like he had
tissue in his urine in the ED

EXAM:
CT ABDOMEN AND PELVIS WITHOUT CONTRAST
TECHNIQUE: Multidetector CT imaging of the abdomen and pelvis was performed
following the standard protocol without IV contrast. Sagittal and
coronal MPR images reconstructed from axial data set. Oral contrast
not administered for this indication.

[Series 4: lung windows · axial · 0.65mm/px · z∈[-163,-88]mm · 5 of 23 slices shown, 10 images]
[im 4/23  soft-tissue]
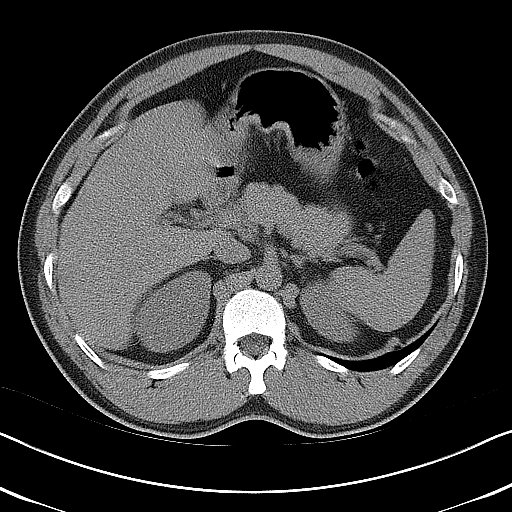
[im 4/23  bone]
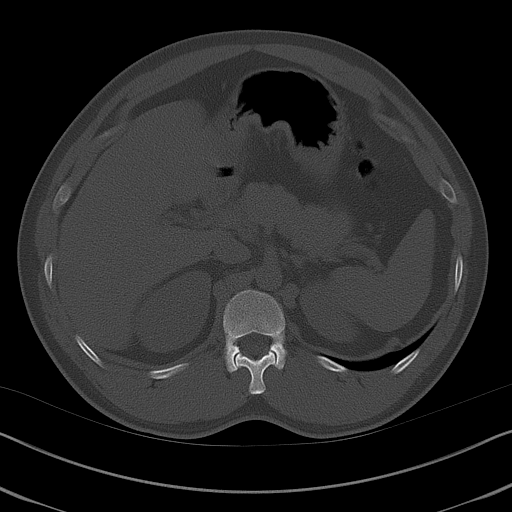
[im 8/23  soft-tissue]
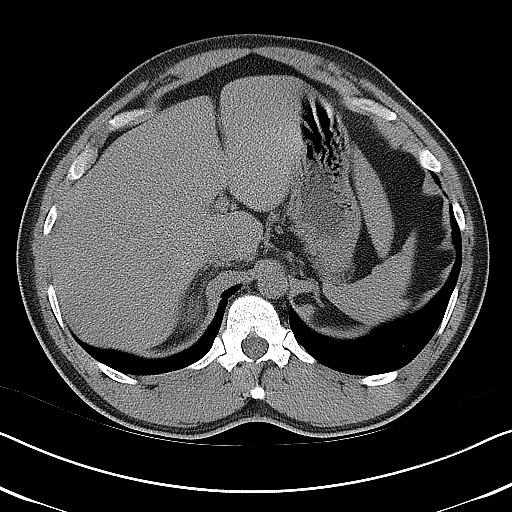
[im 8/23  lung]
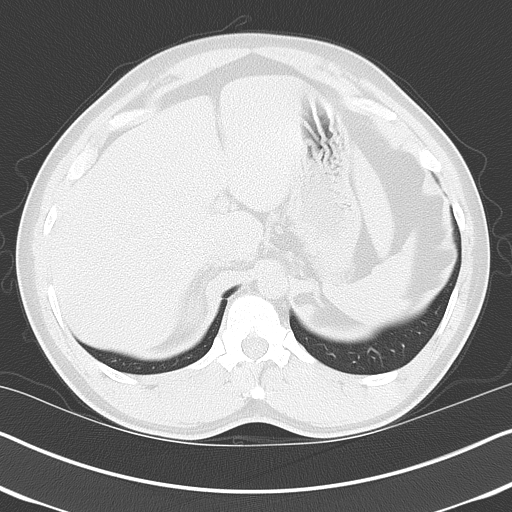
[im 12/23  soft-tissue]
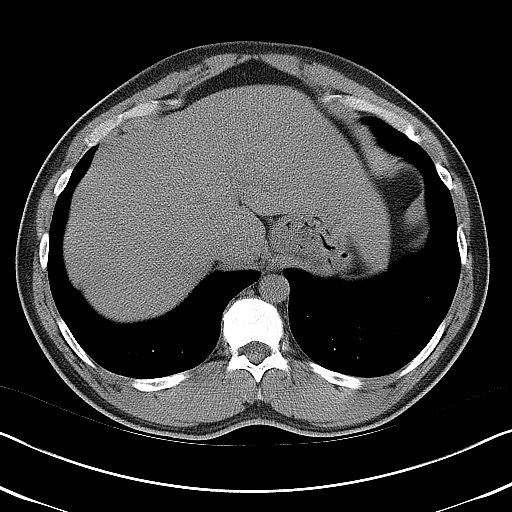
[im 12/23  lung]
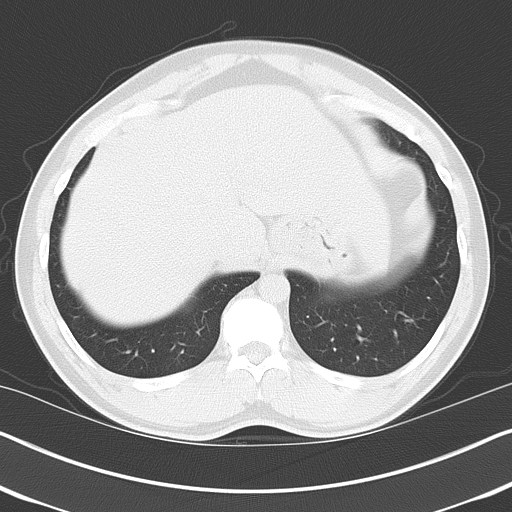
[im 15/23  soft-tissue]
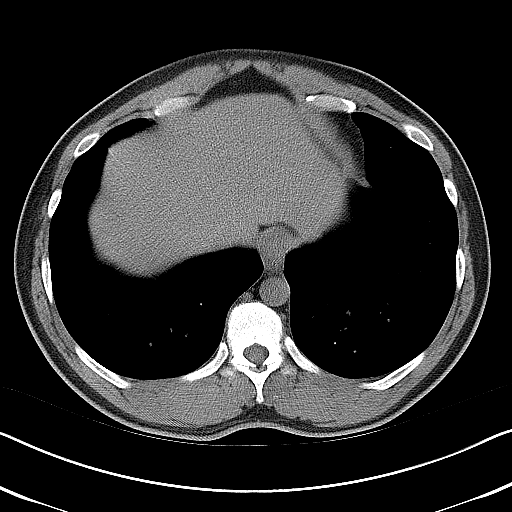
[im 15/23  lung]
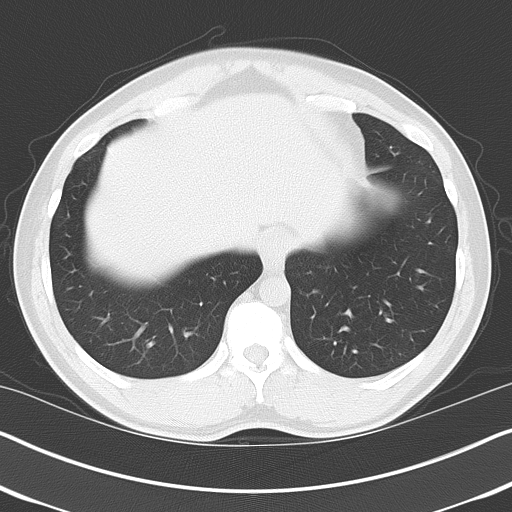
[im 19/23  soft-tissue]
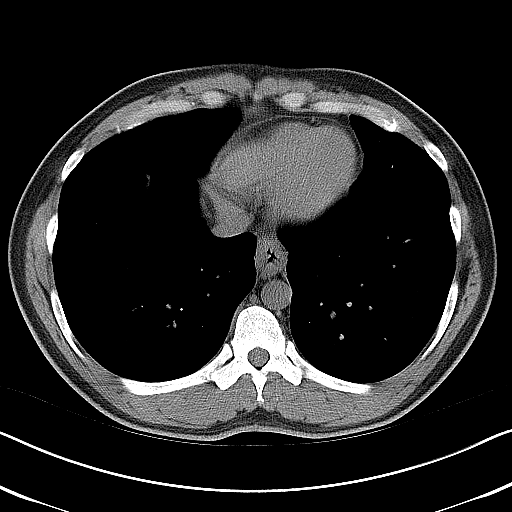
[im 19/23  lung]
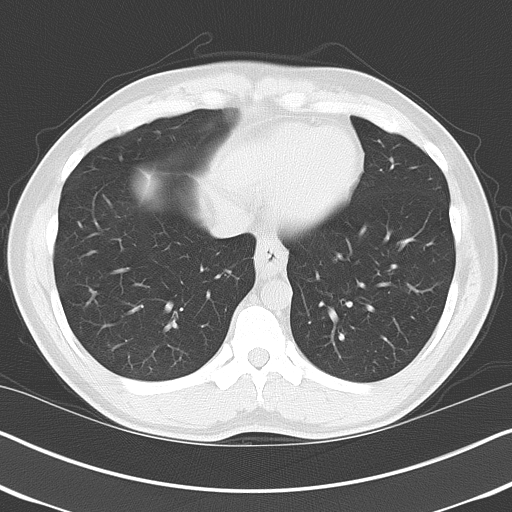

[5 of 32 positions shown; findings below may reference images not displayed]

FINDINGS: Lung bases clear.

Kidneys normal appearance for noncontrast technique.

No mass, hydronephrosis, ureteral dilatation or ureteral
calcification.

Within limits of a nonenhanced exam, liver, gallbladder, spleen,
pancreas, and adrenal glands normal appearance.

Thickened bladder wall with mild perivesicular infiltration
compatible with cystitis.

Normal appendix.

Stomach and bowel loops normal appearance for technique.

No mass, adenopathy, hernia, free air or free fluid.

Osseous structures unremarkable.
IMPRESSION: Thickened bladder wall with mild perivesicular infiltration
compatible with cystitis.

Otherwise negative exam.

## 2017-05-26 IMAGING — DX DG CHEST 2V
2 series · 2 of 2 positions shown · non-contrast
Comparison: 11/16/2015

CLINICAL DATA: Right-sided chest and back

EXAM:
CHEST  2 VIEW

[chest pa]
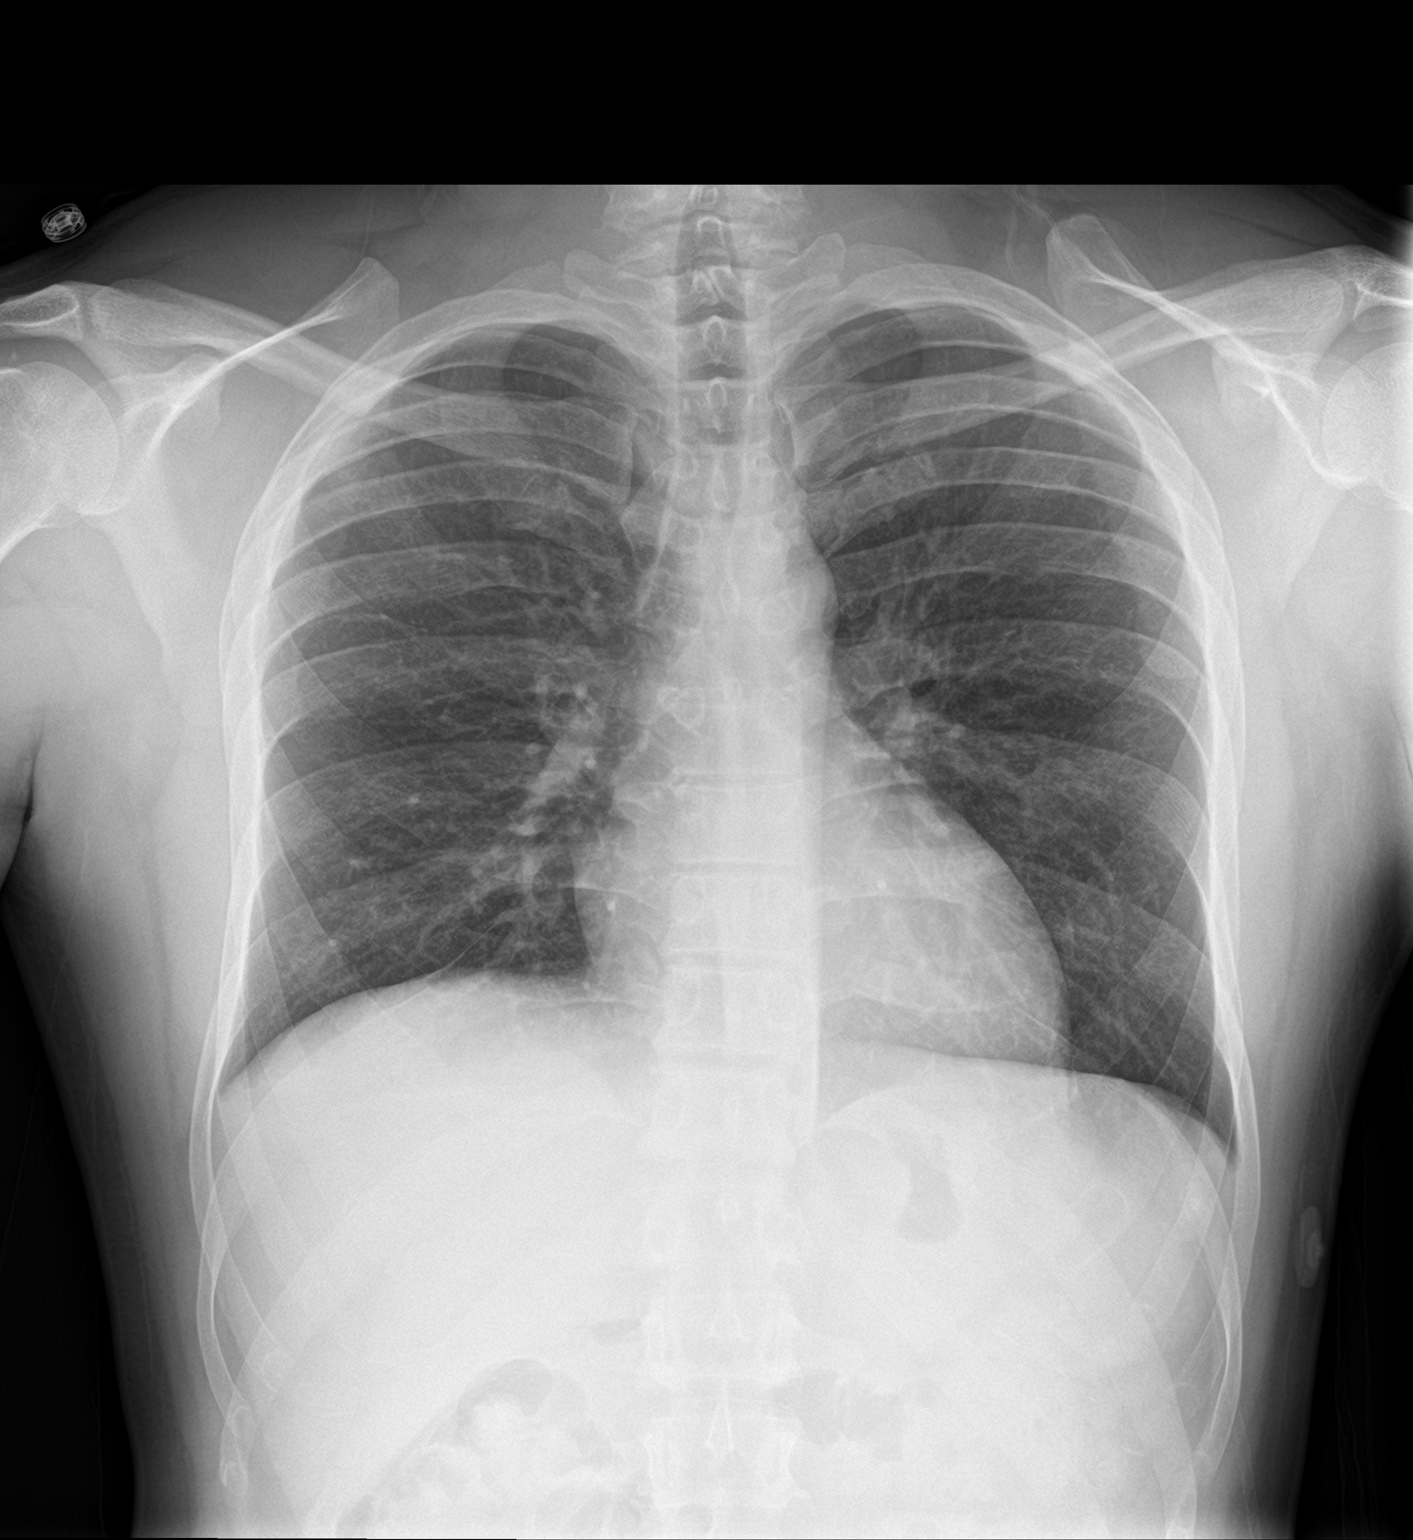

[chest lat]
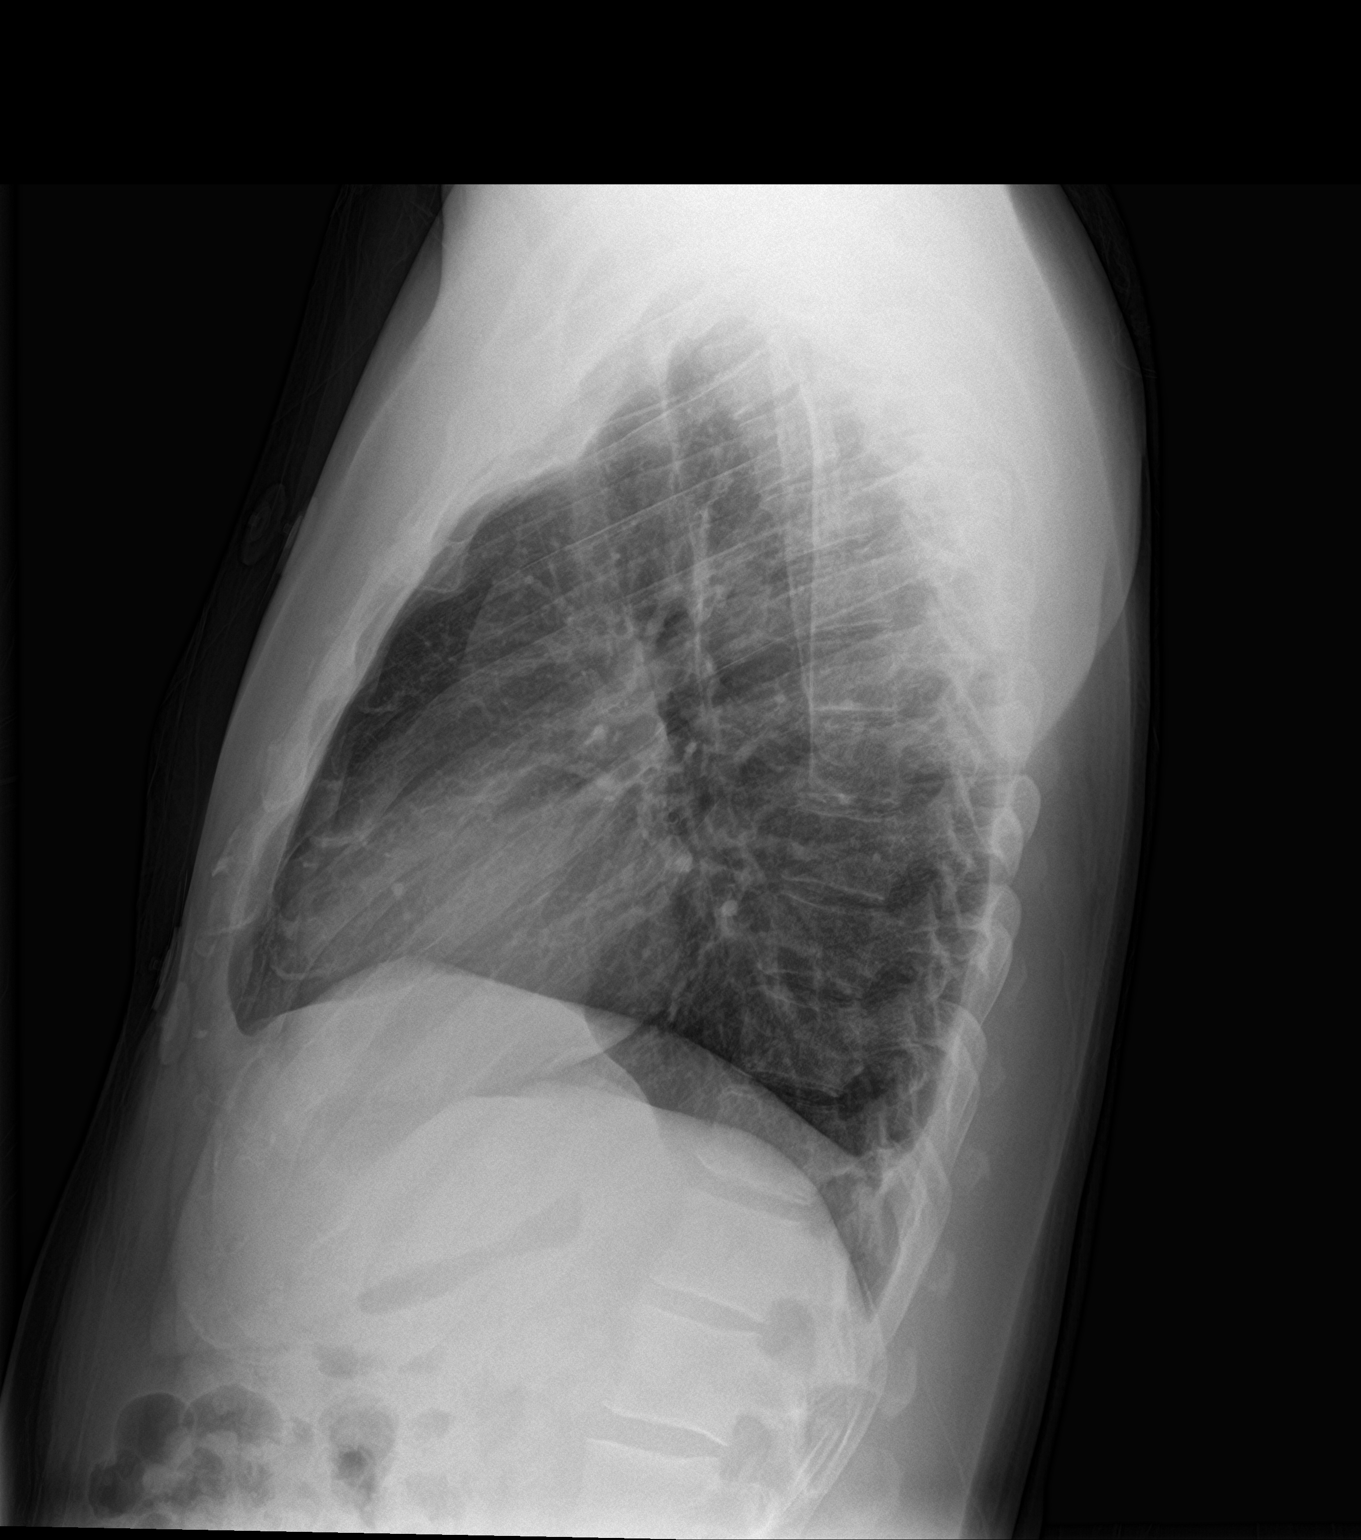

[2 of 2 positions shown; findings below may reference images not displayed]

FINDINGS: The heart size and mediastinal contours are within normal limits.
Both lungs are clear. The visualized skeletal structures are
unremarkable.
IMPRESSION: No active cardiopulmonary disease.

## 2018-12-02 ENCOUNTER — Telehealth: Payer: Self-pay | Admitting: Family Medicine

## 2018-12-02 ENCOUNTER — Ambulatory Visit: Payer: BLUE CROSS/BLUE SHIELD | Admitting: Family Medicine

## 2018-12-03 NOTE — Telephone Encounter (Signed)
error 

## 2018-12-06 ENCOUNTER — Ambulatory Visit: Payer: BLUE CROSS/BLUE SHIELD | Admitting: Family Medicine

## 2018-12-06 ENCOUNTER — Encounter: Payer: Self-pay | Admitting: Family Medicine

## 2018-12-06 VITALS — BP 142/98 | HR 92 | Temp 98.3°F | Resp 16 | Ht 62.5 in | Wt 150.2 lb

## 2018-12-06 DIAGNOSIS — M545 Low back pain: Secondary | ICD-10-CM | POA: Diagnosis not present

## 2018-12-06 DIAGNOSIS — G8929 Other chronic pain: Secondary | ICD-10-CM | POA: Insufficient documentation

## 2018-12-06 DIAGNOSIS — M546 Pain in thoracic spine: Secondary | ICD-10-CM

## 2018-12-06 DIAGNOSIS — F17210 Nicotine dependence, cigarettes, uncomplicated: Secondary | ICD-10-CM | POA: Diagnosis not present

## 2018-12-06 DIAGNOSIS — I10 Essential (primary) hypertension: Secondary | ICD-10-CM | POA: Diagnosis not present

## 2018-12-06 MED ORDER — AMLODIPINE BESYLATE 5 MG PO TABS: 5.0000 mg | ORAL_TABLET | Freq: Every day | ORAL | 0 refills | Status: DC

## 2018-12-06 NOTE — Progress Notes (Signed)
Subjective:    Patient ID: Roberto Murphy, male    DOB: 08/03/1976, 43 y.o.   MRN: [id number]  HPI   Patient presents to clinic to establish primary care.  Main concern today is mid and low back pain.  He has struggled with back pain off and on for many years.  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  States he has to stand between 9 to 11 hours a shift.  States combination of standing and repetitive work, he notices his mid and low back will ache by end of day.  Currently to treat pain he will take an aspirin as needed and use a heating pad.  Denies any numbness or tingling in extremities.  Denies saddle anesthesia.  Denies loss of bowel or bladder control.  Patient's blood pressures also been elevated throughout the past few years.  He has never been on anything for blood pressure control.  When he was seen in the ER approximately 3 years ago for right-sided chest pain, his blood pressure was in the 140s over 90s range.  Denies chest pain, shortness of breath, palpitations, lower extremity swelling.  Past Medical History:  Diagnosis Date  . Arthritis   . Asthma   . Bronchitis   . Chicken pox   . Hypertension    Past Surgical History:  Procedure Laterality Date  . WISDOM TOOTH EXTRACTION      Social History   Tobacco Use  . Smoking status: Current Every Day Smoker  . Smokeless tobacco: Never Used  Substance Use Topics  . Alcohol use: Yes   Family History  Problem Relation Age of Onset  . Heart disease Father    Review of Systems  Constitutional: Negative for chills, fatigue and fever.  HENT: Negative for congestion, ear pain, sinus pain and sore throat.   Eyes: Negative.   Respiratory: Negative for cough, shortness of breath and wheezing.   Cardiovascular: Negative for chest pain, palpitations and leg swelling.  Gastrointestinal: Negative for abdominal pain, diarrhea, nausea and vomiting.    Genitourinary: Negative for dysuria, frequency and urgency.  Musculoskeletal: Mid and low back pain Skin: Negative for color change, pallor and rash.  Neurological: Negative for syncope, light-headedness and headaches.  Psychiatric/Behavioral: The patient is not nervous/anxious.       Objective:   Physical Exam  Constitutional: He appears well-developed and well-nourished. No distress.  HENT:  Head: Normocephalic and atraumatic.  Eyes: Pupils are equal, round, and reactive to light. Conjunctivae and EOM are normal. No scleral icterus.  Neck: Normal range of motion. Neck supple. No tracheal deviation present.  Cardiovascular: Normal rate, regular rhythm and normal heart sounds.  No carotid bruit.  No lower extremity swelling. Pulmonary/Chest: Effort normal and breath sounds normal. No respiratory distress. He has no wheezes. He has no rales.  Abdominal: Soft. Bowel sounds are normal. There is no tenderness.  Neurological: He is alert and oriented to person, place, and time.  Musculoskeletal: Grips equal and strong, quadricep strength equal and strong.  Can bend forward backward, twist side to side, lean side to side without issue.  Gait normal  Skin: Skin is warm and dry. He is not diaphoretic. No pallor.  Psychiatric: He has a normal mood and affect. His behavior is normal. Thought content normal.  Nursing note and vitals reviewed.   Vitals:   12/06/18 0843  BP: (!) 142/98  Pulse: 92  Resp:  16  Temp: 98.3 F (36.8 C)  SpO2: 98%      Assessment & Plan:   Essential hypertension-due to patient having elevated blood pressures a few years ago and also being more elevated today in combination with family history we will begin amlodipine 5 mg once per day.  We will have him do fasting blood work next time he returns to clinic because he did eat breakfast prior to coming to clinic today.  Cigarette smoker - patient smokes about a quarter of a pack per day.  Discussed smoking  cessation.  Greater than 3 minutes spent with patient discussing risks of continued smoking, strategies to reduce smoking.  Patient states he will try to wean himself down.  Declines any sort of NicoDerm patches, nicotine gum to help quit.  Thoracic and lumbar spine pain-offered x-rays in clinic today, but patient declines.  Advised to take Aleve to 220 mg 1 or 2 times per day if needed for low back pain.  Also discussed doing stretching exercises at least 2-3 times per day to help keep joints from becoming too stiff.  Advised he may continue to use heating pad when he has pain to help reduce pain, also discussed topical pain control rubs such as a BenGay or Biofreeze.  Patient will return to clinic in 2 to 3 weeks for recheck of blood pressure after starting amlodipine and also to get fasting labs.

## 2018-12-11 ENCOUNTER — Telehealth: Payer: Self-pay | Admitting: Family Medicine

## 2018-12-11 NOTE — Telephone Encounter (Signed)
I just gave them to Roberto Cover FNP  Today.

## 2018-12-11 NOTE — Telephone Encounter (Signed)
Pt left FMLA paperwork upfront to be filled out. Forms are in color folders upfront

## 2018-12-13 ENCOUNTER — Telehealth: Payer: Self-pay | Admitting: Family Medicine

## 2018-12-13 NOTE — Telephone Encounter (Signed)
Called Pt No answer left a VM to call office back.  Will try back later

## 2018-12-13 NOTE — Telephone Encounter (Signed)
Pt is returning Roberto Murphy call

## 2018-12-13 NOTE — Telephone Encounter (Signed)
Spoke to Pt , Then called Pt Employer No answer left VM.

## 2018-12-13 NOTE — Telephone Encounter (Signed)
Received FMLA form  Need a job description to fill out form, Patient was made aware of this at office visit.   The information on the form says he already works 8 hour shifts, not 11 hour shifts as told to me by patient.   I am getting mixed information here and cannot proceed with form until I have correct information from his employer outlining job description.

## 2018-12-17 ENCOUNTER — Telehealth: Payer: Self-pay | Admitting: Family Medicine

## 2018-12-17 NOTE — Telephone Encounter (Signed)
Pt left additional forms in the front/color folder. Pt says Lauren requested the info.

## 2018-12-17 NOTE — Telephone Encounter (Signed)
I think this is FMLA forms/info

## 2018-12-17 NOTE — Telephone Encounter (Signed)
Picked up paper from front will give to lauren Guse FNP

## 2018-12-25 NOTE — Telephone Encounter (Signed)
° ° °  Pt would like a call back about the status of his FMLA paperwork   [phone]   Pt deadline is 12/30/2018

## 2018-12-26 DIAGNOSIS — Z0279 Encounter for issue of other medical certificate: Secondary | ICD-10-CM

## 2018-12-27 ENCOUNTER — Ambulatory Visit (INDEPENDENT_AMBULATORY_CARE_PROVIDER_SITE_OTHER): Payer: BLUE CROSS/BLUE SHIELD | Admitting: Family Medicine

## 2018-12-27 ENCOUNTER — Encounter: Payer: Self-pay | Admitting: Family Medicine

## 2018-12-27 VITALS — BP 130/80 | HR 97 | Temp 98.9°F | Resp 16 | Ht 62.0 in | Wt 154.0 lb

## 2018-12-27 DIAGNOSIS — M545 Low back pain, unspecified: Secondary | ICD-10-CM

## 2018-12-27 DIAGNOSIS — Z716 Tobacco abuse counseling: Secondary | ICD-10-CM

## 2018-12-27 DIAGNOSIS — F17210 Nicotine dependence, cigarettes, uncomplicated: Secondary | ICD-10-CM

## 2018-12-27 DIAGNOSIS — M546 Pain in thoracic spine: Secondary | ICD-10-CM | POA: Diagnosis not present

## 2018-12-27 DIAGNOSIS — G8929 Other chronic pain: Secondary | ICD-10-CM

## 2018-12-27 DIAGNOSIS — I10 Essential (primary) hypertension: Secondary | ICD-10-CM

## 2018-12-27 MED ORDER — NAPROXEN 500 MG PO TABS: 500.0000 mg | ORAL_TABLET | Freq: Two times a day (BID) | ORAL | 2 refills | Status: AC | PRN

## 2018-12-27 NOTE — Progress Notes (Signed)
Subjective:    Patient ID: Roberto Murphy, male    DOB: Nov 08, 1975, 43 y.o.   MRN: [id number]  HPI   Patient presents to clinic for follow-up on blood pressure, back pain and cigarette smoking.  Patient states he took amlodipine for a few days, but stopped due to not feeling quite right.  Patient states he was really stressed on his first visit here, and believes that is why his BP was elevated.  Patient would prefer not to have to take BP medication.  Continues to have chronic back pain, usually flares up after long day of work.  FMLA paperwork completed for patient.  Patient states his company will at times require mandatory overtime, they will work 6 to 7 days/week 11-hour shifts.  FMLA paperwork restricts him to working 8-hour shifts, 5 days a week.  Patient continues to smoke between 1/2 to 1 pack/day.  Patient has tried different ways to help quitting including using a vape, patches, gum however has never been successful.   Patient Active Problem List   Diagnosis Date Noted  . Essential hypertension 12/06/2018  . Chronic bilateral thoracic back pain 12/06/2018  . Cigarette smoker 12/06/2018  . Chronic bilateral low back pain without sciatica 12/06/2018   Social History   Tobacco Use  . Smoking status: Current Every Day Smoker  . Smokeless tobacco: Never Used  Substance Use Topics  . Alcohol use: Yes   Review of Systems  Constitutional: Negative for chills, fatigue and fever.  HENT: Negative for congestion, ear pain, sinus pain and sore throat.   Eyes: Negative.   Respiratory: Negative for cough, shortness of breath and wheezing.   Cardiovascular: Negative for chest pain, palpitations and leg swelling.  Gastrointestinal: Negative for abdominal pain, diarrhea, nausea and vomiting.  Genitourinary: Negative for dysuria, frequency and urgency.  Musculoskeletal: +chronic mid and low back pain  Skin: Negative for color change, pallor and rash.  Neurological: Negative for  syncope, light-headedness and headaches.  Psychiatric/Behavioral: The patient is not nervous/anxious.       Objective:   Physical Exam  Constitutional: He appears well-developed and well-nourished. No distress.  HENT:  Head: Normocephalic and atraumatic.  Eyes: Conjunctivae and EOM are normal. No scleral icterus.  Neck: Normal range of motion. Neck supple. No tracheal deviation present.  Cardiovascular: Normal rate, regular rhythm and normal heart sounds. No LE edema. No carotid bruit.  Pulmonary/Chest: Effort normal and breath sounds normal. No respiratory distress. He has no wheezes. He has no rales.  Neurological: He is alert and oriented to person, place, and time. Gait normal  Skin: Skin is warm and dry. He is not diaphoretic. No pallor.  Psychiatric: He has a normal mood and affect. His behavior is normal. Thought content normal.   Nursing note and vitals reviewed.  Vitals:   12/27/18 0840  BP: 130/80  Pulse: 97  Resp: 16  Temp: 98.9 F (37.2 C)  SpO2: 98%   BP Readings from Last 3 Encounters:  12/27/18 130/80  12/06/18 (!) 142/98  08/11/16 101/86      Assessment & Plan:   Essential hypertension - patient's BP looks very good today.  Patient would prefer not to have to take blood pressure medication.  He will watch his BP readings, follow a diet and regular exercise and we will monitor BP throughout his office visits to see if we do actually need to get him back on medication.  Chronic thoracic and low back pain - FMLA paperwork completed for patient.  He will use naproxen as needed to help reduce pain.  Also discussed doing stretches throughout the day to keep muscles and bones from becoming too stiff/tight, use heating pad, topical rub like biofreeze  Cigarette smoker/smoking cessation counseling- greater than 3 minutes spent discussing smoking cessation counseling the patient.  Outlined different strategies like using nicotine patches and gum, using Chantix with gum,  slowly weaning down.  Patient is not ready to quit at this time but will think about different options.  Aware of risks of continued smoking including vascular issues, cancers, breathing problems and death.  Patient will follow-up in 3 months for recheck on BP and also to revisit smoking cessation.  He is aware he can return to clinic sooner if any issues arise.

## 2018-12-27 NOTE — Patient Instructions (Signed)
Steps to Quit Smoking    Smoking tobacco can be bad for your health. It can also affect almost every organ in your body. Smoking puts you and people around you at risk for many serious long-lasting (chronic) diseases. Quitting smoking is hard, but it is one of the best things that you can do for your health. It is never too late to quit.  What are the benefits of quitting smoking?  When you quit smoking, you lower your risk for getting serious diseases and conditions. They can include:  · Lung cancer or lung disease.  · Heart disease.  · Stroke.  · Heart attack.  · Not being able to have children (infertility).  · Weak bones (osteoporosis) and broken bones (fractures).  If you have coughing, wheezing, and shortness of breath, those symptoms may get better when you quit. You may also get sick less often. If you are pregnant, quitting smoking can help to lower your chances of having a baby of low birth weight.  What can I do to help me quit smoking?  Talk with your doctor about what can help you quit smoking. Some things you can do (strategies) include:  · Quitting smoking totally, instead of slowly cutting back how much you smoke over a period of time.  · Going to in-person counseling. You are more likely to quit if you go to many counseling sessions.  · Using resources and support systems, such as:  ? Online chats with a counselor.  ? Phone quitlines.  ? Printed self-help materials.  ? Support groups or group counseling.  ? Text messaging programs.  ? Mobile phone apps or applications.  · Taking medicines. Some of these medicines may have nicotine in them. If you are pregnant or breastfeeding, do not take any medicines to quit smoking unless your doctor says it is okay. Talk with your doctor about counseling or other things that can help you.  Talk with your doctor about using more than one strategy at the same time, such as taking medicines while you are also going to in-person counseling. This can help make  quitting easier.  What things can I do to make it easier to quit?  Quitting smoking might feel very hard at first, but there is a lot that you can do to make it easier. Take these steps:  · Talk to your family and friends. Ask them to support and encourage you.  · Call phone quitlines, reach out to support groups, or work with a counselor.  · Ask people who smoke to not smoke around you.  · Avoid places that make you want (trigger) to smoke, such as:  ? Bars.  ? Parties.  ? Smoke-break areas at work.  · Spend time with people who do not smoke.  · Lower the stress in your life. Stress can make you want to smoke. Try these things to help your stress:  ? Getting regular exercise.  ? Deep-breathing exercises.  ? Yoga.  ? Meditating.  ? Doing a body scan. To do this, close your eyes, focus on one area of your body at a time from head to toe, and notice which parts of your body are tense. Try to relax the muscles in those areas.  · Download or buy apps on your mobile phone or tablet that can help you stick to your quit plan. There are many free apps, such as QuitGuide from the CDC (Centers for Disease Control and Prevention). You can find more   support from smokefree.gov and other websites.  This information is not intended to replace advice given to you by your health care provider. Make sure you discuss any questions you have with your health care provider.  Document Released: 08/05/2009 Document Revised: 06/06/2016 Document Reviewed: 02/23/2015  Elsevier Interactive Patient Education © 2019 [employer]

## 2019-03-06 ENCOUNTER — Encounter: Payer: Self-pay | Admitting: Family Medicine

## 2019-03-06 ENCOUNTER — Other Ambulatory Visit: Payer: Self-pay

## 2019-03-06 ENCOUNTER — Ambulatory Visit (INDEPENDENT_AMBULATORY_CARE_PROVIDER_SITE_OTHER): Payer: BLUE CROSS/BLUE SHIELD | Admitting: Family Medicine

## 2019-03-06 ENCOUNTER — Telehealth: Payer: Self-pay | Admitting: Family Medicine

## 2019-03-06 DIAGNOSIS — M546 Pain in thoracic spine: Secondary | ICD-10-CM

## 2019-03-06 DIAGNOSIS — F17210 Nicotine dependence, cigarettes, uncomplicated: Secondary | ICD-10-CM

## 2019-03-06 DIAGNOSIS — G8929 Other chronic pain: Secondary | ICD-10-CM

## 2019-03-06 DIAGNOSIS — I10 Essential (primary) hypertension: Secondary | ICD-10-CM | POA: Diagnosis not present

## 2019-03-06 DIAGNOSIS — M545 Low back pain: Secondary | ICD-10-CM | POA: Diagnosis not present

## 2019-03-06 NOTE — Telephone Encounter (Signed)
Appt scheduled for 06/06/2019 @ 9:00am Pt stated he couldn't get into MyChart for the Note, and he would like to have it mailed to his home.

## 2019-03-06 NOTE — Progress Notes (Signed)
Patient ID: Roberto Murphy, male   DOB: [birth year], 43 y.o.   MRN: [id number]    Virtual Visit via video Note  This visit type was conducted due to national recommendations for restrictions regarding the COVID-19 pandemic (e.g. social distancing).  This format is felt to be most appropriate for this patient at this time.  All issues noted in this document were discussed and addressed.  No physical exam was performed (except for noted visual exam findings with Video Visits).   I connected with Gerre Couch today at  1:20 PM EDT by a video enabled telemedicine application and verified that I am speaking with the correct person using two identifiers. Location patient: home Location provider: [facility] Persons participating in the virtual visit: patient, provider  I discussed the limitations, risks, security and privacy concerns of performing an evaluation and management service by video and the availability of in person appointments. I also discussed with the patient that there may be a patient responsible charge related to this service. The patient expressed understanding and agreed to proceed.   HPI:  Patient and I connected via video for follow-up on chronic back pain, blood pressure and cigarette smoking.  Patient states he continues to have back pain almost every day, but takes an Aleve and that usually does help.  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  Denies any falls or new injury to back.  Denies saddle anesthesia, leg numbness or loss of bowel or bladder control.  Patient has not checked his blood pressure in the past few weeks.  Denies any chest pain, palpitations or leg swelling.  Patient has been on amlodipine previously, but BP was good without the  medication so we decided to monitor BP for a while before resuming the medication.  Patient continues to smoke.  States does not feel able to quit at this time.   ROS: See pertinent positives and negatives per HPI.  Past Medical History:  Diagnosis Date  . Arthritis   . Asthma   . Bronchitis   . Chicken pox   . Hypertension     Past Surgical History:  Procedure Laterality Date  . WISDOM TOOTH EXTRACTION      Family History  Problem Relation Age of Onset  . Heart disease Father    Social History   Tobacco Use  . Smoking status: Current Every Day Smoker  . Smokeless tobacco: Never Used  Substance Use Topics  . Alcohol use: Yes     Current Outpatient Medications:  .  naproxen (NAPROSYN) 500 MG tablet, Take 1 tablet (500 mg total) by mouth 2 (two) times daily as needed., Disp: 30 tablet, Rfl: 2  EXAM:  GENERAL: alert, oriented, appears well and in no acute distress  HEENT: atraumatic, conjunttiva clear, no obvious abnormalities on inspection of external nose and ears  NECK: normal movements of the head and neck  LUNGS: on inspection no signs of respiratory distress, breathing rate appears normal, no obvious gross SOB, gasping or wheezing  CV: no obvious cyanosis  MS: moves all visible extremities without noticeable abnormality  PSYCH/NEURO: pleasant and cooperative, no obvious depression or anxiety, speech and thought processing grossly intact  ASSESSMENT AND PLAN:  Discussed the following assessment and plan:  Chronic bilateral thoracic back pain  Chronic bilateral low back pain without sciatica  Essential hypertension  Cigarette smoker  Patient will continue use Aleve as needed for chronic back pain also advised to do daily stretching and use topical rubs such as BenGay or Biofreeze, topical lidocaine to help soothe pain.  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  We will plan to recheck blood pressure at his next visit.  Advised that if he is able to get a blood pressure cuff or check his blood pressure at the pharmacy please do so and let us know the reading.  3 minutes spent discussing smoking cessation, patient understands risks of continued smoking but does not feel he is able to quit at this time.   I discussed the assessment and treatment plan with the patient. The patient was provided an opportunity to ask questions and all were answered. The patient agreed with the plan and demonstrated an understanding of the instructions.   The patient was advised to call back or seek an in-person evaluation if the symptoms worsen or if the condition fails to improve as anticipated.   15 minutes spent via video with patient.   Patient will follow-up in 3 months for recheck on chronic conditions.  He is aware he can call clinic sooner if any issues arise.  Tracey HarriesLauren M Anik Wesch, FNP

## 2019-03-06 NOTE — Telephone Encounter (Signed)
Mailed to Patient

## 2019-03-06 NOTE — Telephone Encounter (Signed)
Please call to schedule 3 month follow up and remind him to set up his my chart so I can release work note (currently note is pended until my chart set up)  Thanks  LG

## 2019-03-06 NOTE — Telephone Encounter (Signed)
Letter printed off.

## 2019-06-06 ENCOUNTER — Telehealth: Payer: Self-pay | Admitting: Lab

## 2019-06-06 ENCOUNTER — Ambulatory Visit: Payer: BLUE CROSS/BLUE SHIELD | Admitting: Family Medicine

## 2019-06-06 DIAGNOSIS — Z0289 Encounter for other administrative examinations: Secondary | ICD-10-CM

## 2019-06-06 NOTE — Telephone Encounter (Signed)
Called Pt to remind him about appt No answer left message on VM.

## 2019-06-06 NOTE — Telephone Encounter (Signed)
No show for 06/06/2019

## 2019-06-09 NOTE — Telephone Encounter (Signed)
No show applied to visit.
